# Patient Record
Sex: Male | Born: 1965 | Race: White | Hispanic: No | Marital: Married | Smoking: Current every day smoker
Health system: Southern US, Community
[De-identification: ages and names within clinical notes are randomized; demographics above are authoritative.]

---

## 2018-04-10 ENCOUNTER — Emergency Department (INDEPENDENT_AMBULATORY_CARE_PROVIDER_SITE_OTHER)
Admission: EM | Admit: 2018-04-10 | Discharge: 2018-04-10 | Disposition: A | Discharge status: Home / Self Care | Payer: BLUE CROSS/BLUE SHIELD | Source: Home / Self Care | Attending: Family Medicine | Admitting: Family Medicine

## 2018-04-10 ENCOUNTER — Other Ambulatory Visit: Payer: Self-pay

## 2018-04-10 ENCOUNTER — Emergency Department (INDEPENDENT_AMBULATORY_CARE_PROVIDER_SITE_OTHER): Payer: BLUE CROSS/BLUE SHIELD

## 2018-04-10 ENCOUNTER — Encounter: Payer: Self-pay | Admitting: Emergency Medicine

## 2018-04-10 DIAGNOSIS — M25571 Pain in right ankle and joints of right foot: Secondary | ICD-10-CM

## 2018-04-10 DIAGNOSIS — M79671 Pain in right foot: Secondary | ICD-10-CM

## 2018-04-10 NOTE — Discharge Instructions (Signed)
°  Please follow up with an orthopedist in 1-2 weeks if not improving.

## 2018-04-10 NOTE — ED Triage Notes (Signed)
Right ankle pain x 2 days walking and heard a pop

## 2018-04-10 NOTE — ED Provider Notes (Signed)
[facility]    CSN: [id number] Arrival date & time: 04/10/18  1458     History   Chief Complaint Chief Complaint  Patient presents with  . Ankle Pain    HPI Frank Butler is a 52 y.o. male.   HPI Frank Butler is a 52 y.o. male presenting to UC with c/o 2 days of Left ankle pain that started while he was walking. He reports hearing a "pop" in his ankle. It has popped again today but that made it feel better this time. Pain was 5/10 but is now 3/10, aching and sore, worse with ambulation. 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  He has taken ibuprofen with some relief. No prior fracture or surgery to same ankle or foot.    History reviewed. No pertinent past medical history.  There are no active problems to display for this patient.   History reviewed. No pertinent surgical history.     Home Medications    Prior to Admission medications   Medication Sig Start Date End Date Taking? Authorizing Provider  ibuprofen (ADVIL,MOTRIN) 200 MG tablet Take 200 mg by mouth every 6 (six) hours as needed.   Yes [provider]    Family History No family history on file.  Social History Social History   Tobacco Use  . Smoking status: Current Every Day Smoker    Types: E-cigarettes  . Smokeless tobacco: Never Used  Substance Use Topics  . Alcohol use: Yes  . Drug use: Not Currently     Allergies   Patient has no known allergies.   Review of Systems Review of Systems  Musculoskeletal: Positive for arthralgias. Negative for joint swelling and myalgias.  Skin: Negative for color change and wound.  Neurological: Negative for weakness and numbness.     Physical Exam Triage Vital Signs ED Triage Vitals [04/10/18 1522]  Enc Vitals Group     BP 131/88     Pulse Rate 88     Resp      Temp (!) 97.3 F (36.3 C)     Temp Source Oral     SpO2 96 %     Weight 185 lb (83.9 kg)     Height 6'  2" (1.88 m)     Head Circumference      Peak Flow      Pain Score 5     Pain Loc      Pain Edu?      Excl. in GC?    No data found.  Updated Vital Signs BP 131/88 (BP Location: Right Arm)   Pulse 88   Temp (!) 97.3 F (36.3 C) (Oral)   Ht 6\' 2"  (1.88 m)   Wt 185 lb (83.9 kg)   SpO2 96%   BMI 23.75 kg/m   Visual Acuity Right Eye Distance:   Left Eye Distance:   Bilateral Distance:    Right Eye Near:   Left Eye Near:    Bilateral Near:     Physical Exam  Constitutional: He is oriented to person, place, and time. He appears well-developed and well-nourished. No distress.  HENT:  Head: Normocephalic and atraumatic.  Eyes: EOM are normal.  Neck: Normal range of motion.  Cardiovascular: Normal rate.  Pulmonary/Chest: Effort normal.  Musculoskeletal: Normal range of motion. He exhibits tenderness. He exhibits no edema.  Right ankle and foot: no obvious edema. Tenderness to lateral  malleolus and lateral proximal 5th metatarsal. Full ROM ankle and toes.  Calf is soft, non-tender.  Neurological: He is alert and oriented to person, place, and time.  Skin: Skin is warm and dry. He is not diaphoretic.  Psychiatric: He has a normal mood and affect. His behavior is normal.  Nursing note and vitals reviewed.    UC Treatments / Results  Labs (all labs ordered are listed, but only abnormal results are displayed) Labs Reviewed - No data to display  EKG None  Radiology Dg Ankle Complete Right  Result Date: 04/10/2018 CLINICAL DATA:  Right ankle pain for 1 week, no known injury, initial encounter EXAM: RIGHT ANKLE - COMPLETE 3+ VIEW COMPARISON:  None. FINDINGS: No acute fracture or dislocation is noted. Accessory ossicle is noted laterally adjacent to the cuboid bone. Calcaneal spurring is seen. IMPRESSION: No acute abnormality noted. Electronically Signed   By: Alcide Clever M.D.   On: 04/10/2018 15:45   Dg Foot Complete Right  Result Date: 04/10/2018 CLINICAL DATA:  52  year-old male c/o RIGHT foot pain to the calcaneus and lateral foot x 1 week. Pt denies injury. States he woke up with the pain EXAM: RIGHT FOOT COMPLETE - 3+ VIEW COMPARISON:  None. FINDINGS: There is no evidence of fracture or dislocation. There is no evidence of arthropathy or other focal bone abnormality. Soft tissues are unremarkable. IMPRESSION: Negative. Electronically Signed   By: Norva Pavlov M.D.   On: 04/10/2018 15:44    Procedures Procedures (including critical care time)  Medications Ordered in UC Medications - No data to display  Initial Impression / Assessment and Plan / UC Course  I have reviewed the triage vital signs and the nursing notes.  Pertinent labs & imaging results that were available during my care of the patient were reviewed by me and considered in my medical decision making (see chart for details).     Discussed imaging with pt. Reassured no fracture Offered ankle splint, pt declined. Home care instructions provided. Work note also offered, pt declined at this time but will call back if he decides he needs one.  Final Clinical Impressions(s) / UC Diagnoses   Final diagnoses:  Right ankle pain  Right foot pain     Discharge Instructions      Please follow up with an orthopedist in 1-2 weeks if not improving.     ED Prescriptions    None     Controlled Substance Prescriptions [place] Controlled Substance Registry consulted? Not Applicable   Lurene Shadow, PA-C 04/10/18 1859

## 2019-11-07 IMAGING — DX DG ANKLE COMPLETE 3+V*R*
3 series · 3 of 3 positions shown · non-contrast
Comparison: None.

CLINICAL DATA: Right ankle pain for 1 week, no known injury,
initial encounter

EXAM:
RIGHT ANKLE - COMPLETE 3+ VIEW

[ankle ap]
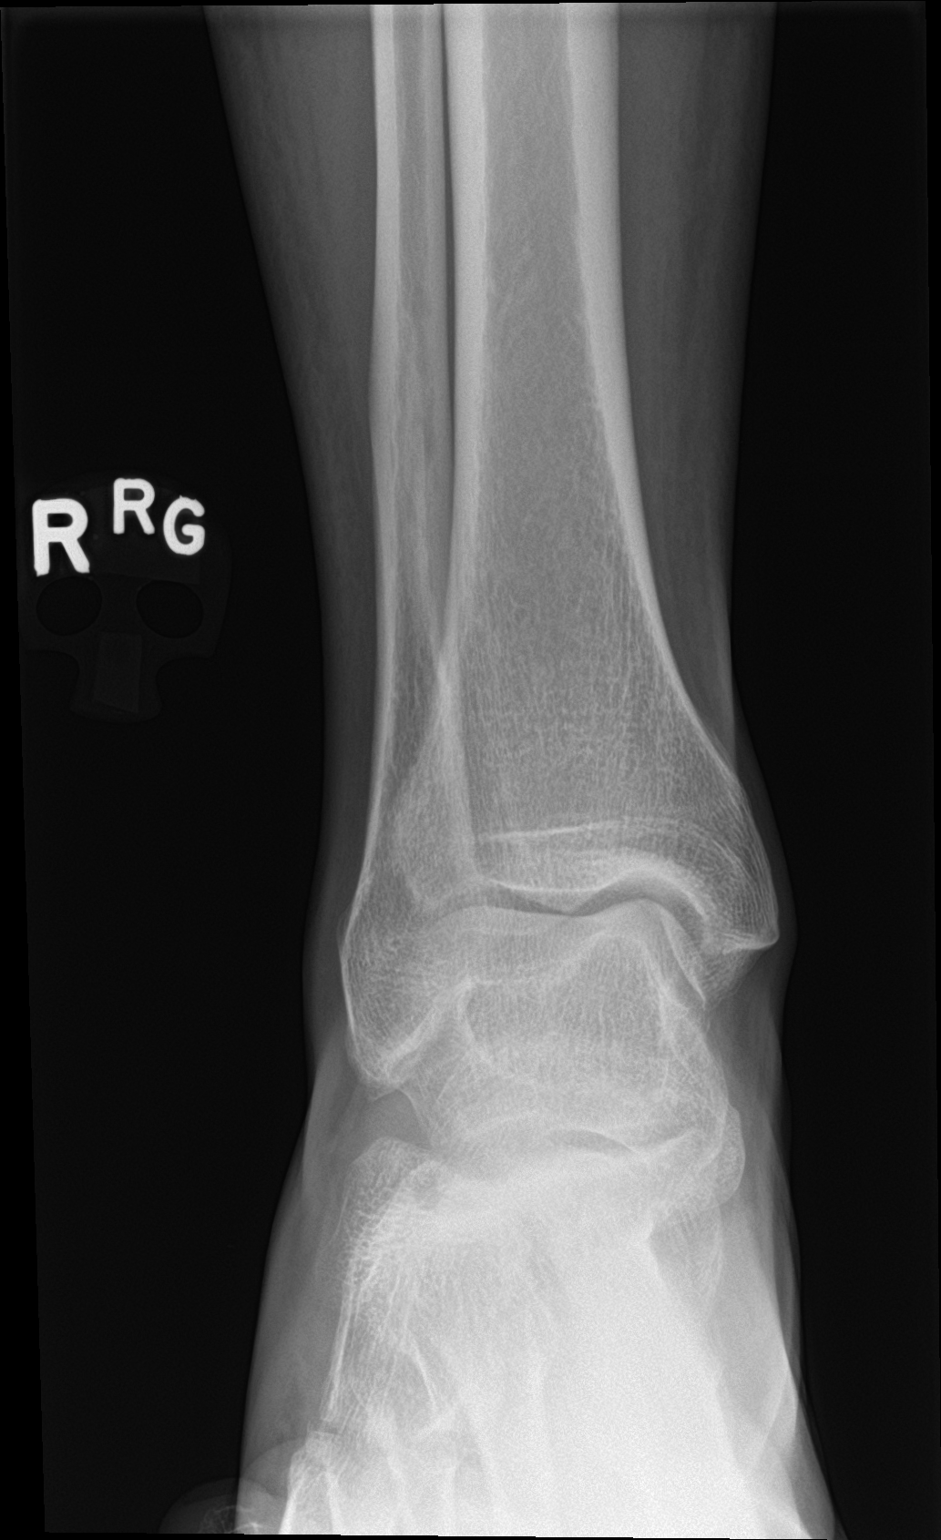

[ankle obl]
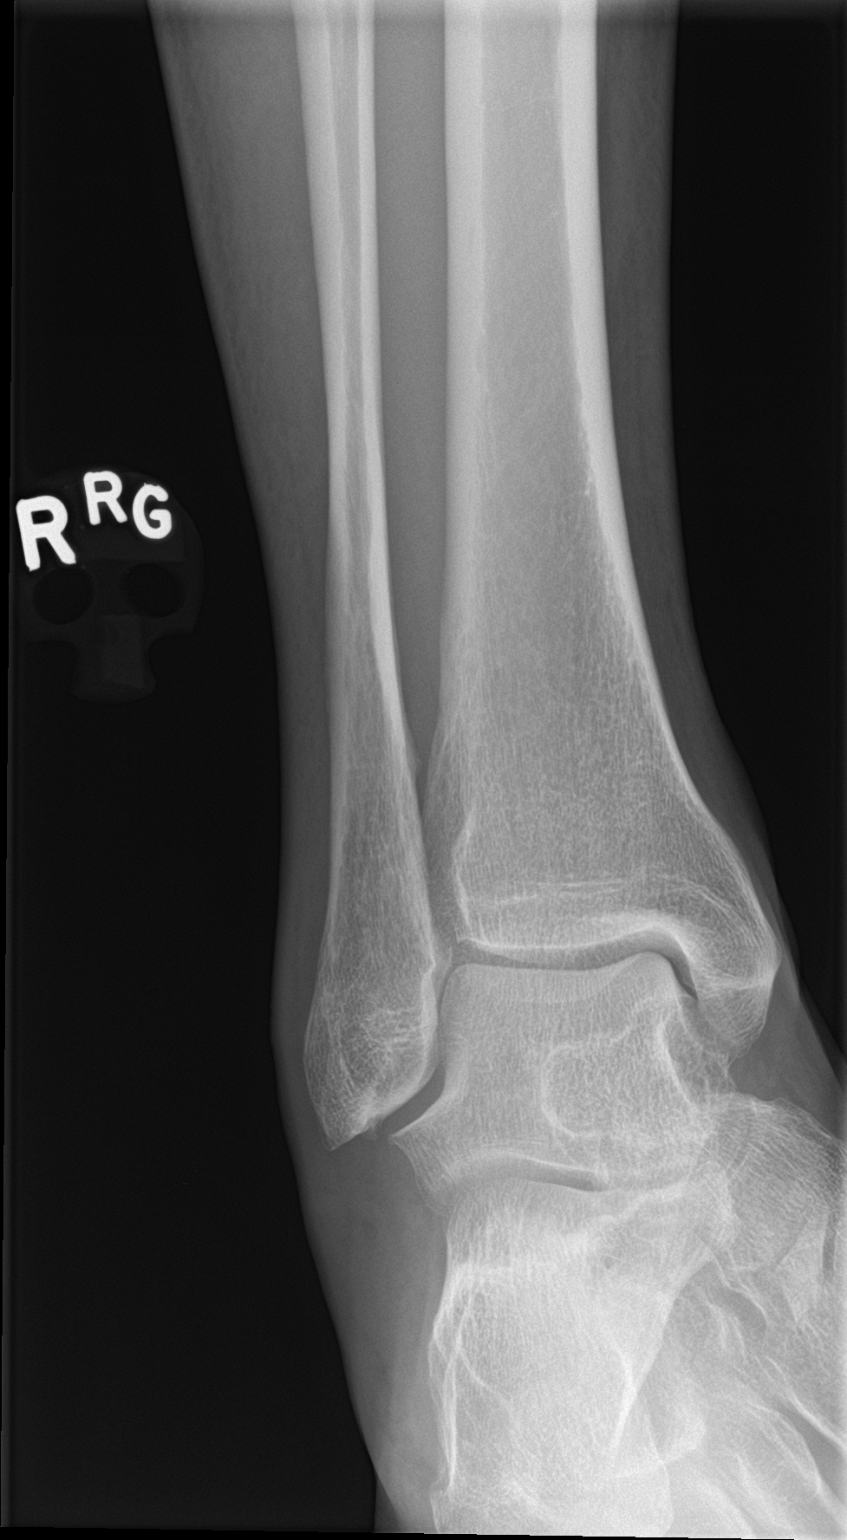

[ankle lat]
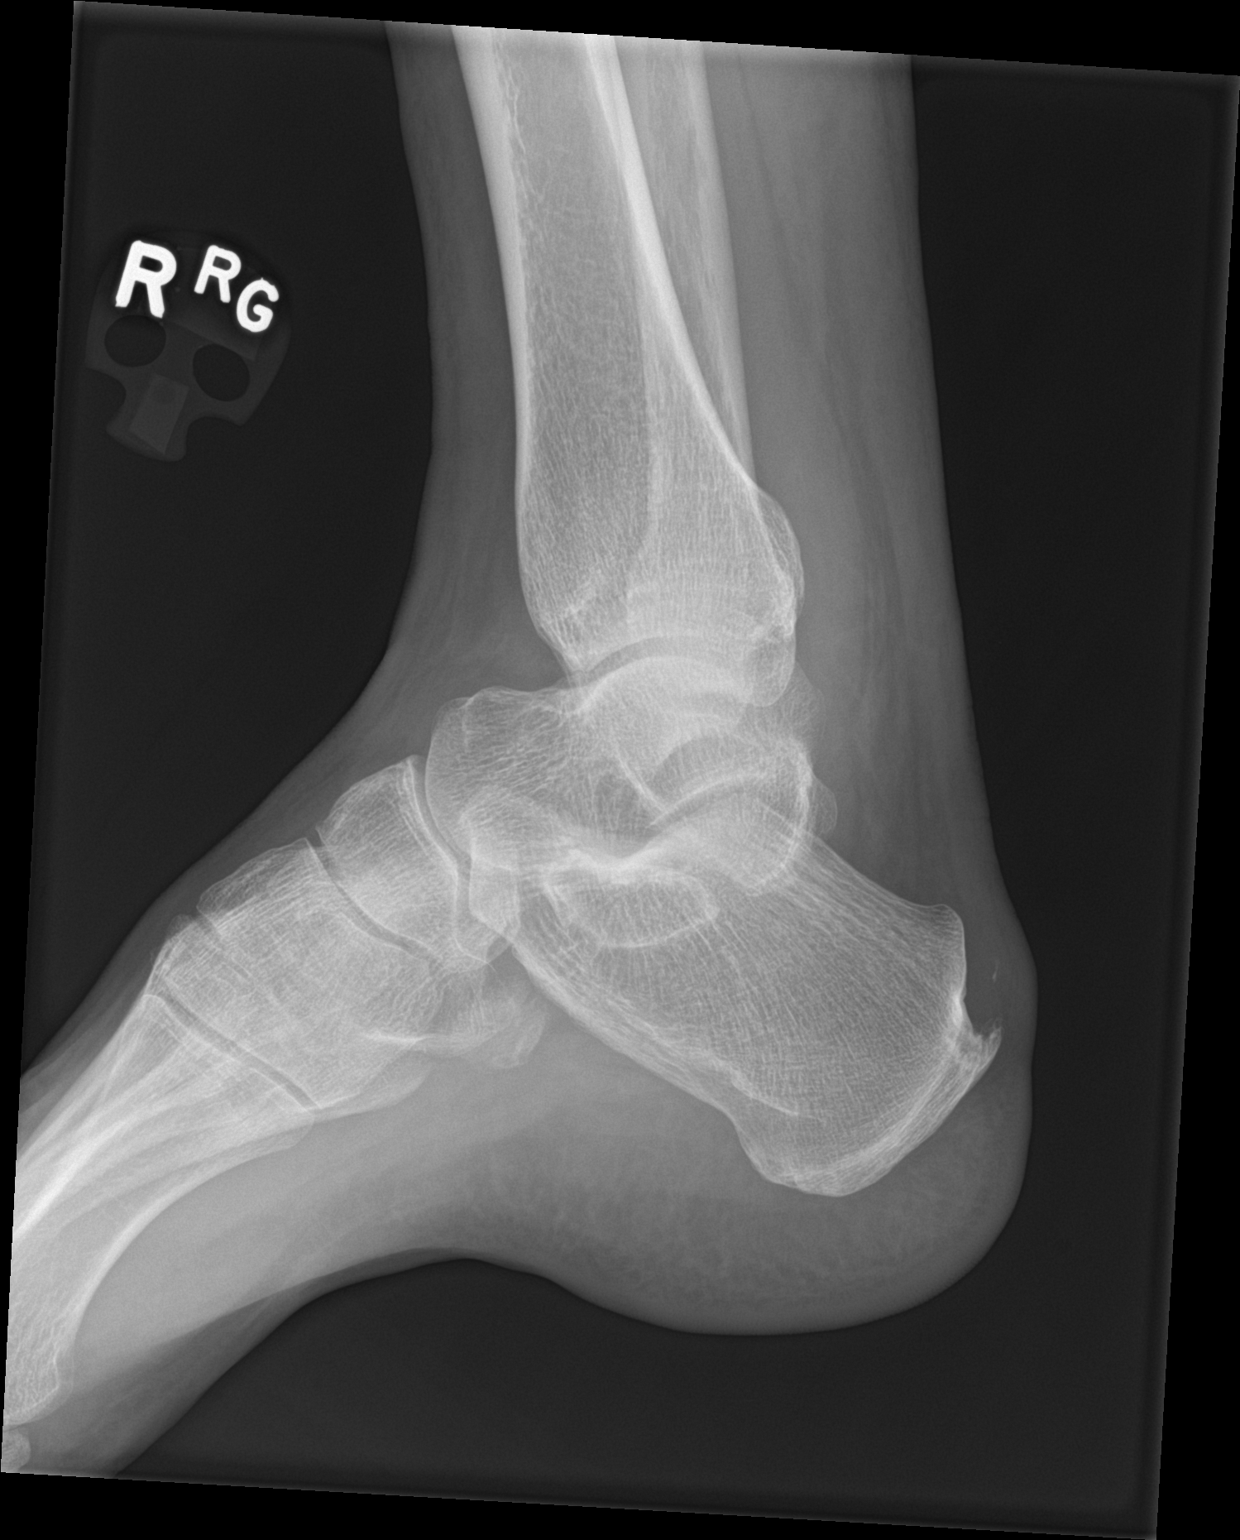

[3 of 3 positions shown; findings below may reference images not displayed]

FINDINGS: No acute fracture or dislocation is noted. Accessory ossicle is
noted laterally adjacent to the cuboid bone. Calcaneal spurring is
seen.
IMPRESSION: No acute abnormality noted.
